# Patient Record
Sex: Female | Born: 1955 | Race: White | Hispanic: No | Smoking: Never smoker
Health system: Southern US, Community
[De-identification: ages and names within clinical notes are randomized; demographics above are authoritative.]

## PROBLEM LIST (undated history)

## (undated) DIAGNOSIS — J449 Chronic obstructive pulmonary disease, unspecified: Secondary | ICD-10-CM

## (undated) DIAGNOSIS — R87629 Unspecified abnormal cytological findings in specimens from vagina: Secondary | ICD-10-CM

## (undated) DIAGNOSIS — M1990 Unspecified osteoarthritis, unspecified site: Secondary | ICD-10-CM

## (undated) HISTORY — DX: Chronic obstructive pulmonary disease, unspecified: J44.9

## (undated) HISTORY — DX: Unspecified abnormal cytological findings in specimens from vagina: R87.629

## (undated) HISTORY — DX: Unspecified osteoarthritis, unspecified site: M19.90

---

## 2013-01-09 ENCOUNTER — Ambulatory Visit (INDEPENDENT_AMBULATORY_CARE_PROVIDER_SITE_OTHER): Payer: No Typology Code available for payment source

## 2013-01-09 ENCOUNTER — Encounter (INDEPENDENT_AMBULATORY_CARE_PROVIDER_SITE_OTHER): Payer: Self-pay

## 2013-01-09 VITALS — BP 128/50 | HR 73 | Resp 18

## 2013-01-09 DIAGNOSIS — M722 Plantar fascial fibromatosis: Secondary | ICD-10-CM

## 2013-01-09 DIAGNOSIS — M79609 Pain in unspecified limb: Secondary | ICD-10-CM

## 2013-01-09 DIAGNOSIS — D219 Benign neoplasm of connective and other soft tissue, unspecified: Secondary | ICD-10-CM

## 2013-01-09 DIAGNOSIS — D3610 Benign neoplasm of peripheral nerves and autonomic nervous system, unspecified: Secondary | ICD-10-CM

## 2013-01-09 DIAGNOSIS — M2010 Hallux valgus (acquired), unspecified foot: Secondary | ICD-10-CM

## 2013-01-09 MED ORDER — DICLOFENAC SODIUM 75 MG PO TBEC: 75.0000 mg | DELAYED_RELEASE_TABLET | Freq: Two times a day (BID) | ORAL | Status: DC

## 2013-01-09 NOTE — Progress Notes (Signed)
  Subjective:    Patient ID: Sheri Little, female    DOB: August 24, 1955, 57 y.o.   MRN: [id number]  HPI my right foot hurts on the balls of the foot and the bunion area and then it goes up into my toes and I work long hours and not much swelling and hurts in my arch some and went to primary care about 3 weeks ago and got my foot x-rayed Patient and she is complaining of knots or tightening in the arch of her foot right foot more so than left. Also notable bunion deformity right more significant than left. Has difficult time finding shoes to fit properly due to the width of her right foot.   Review of Systems  Constitutional: Negative.   HENT: Negative.   Eyes: Negative.   Respiratory: Negative.   Cardiovascular: Negative.   Gastrointestinal: Negative.   Endocrine: Negative.   Genitourinary: Negative.   Musculoskeletal:       Joint pain  Skin: Negative.   Allergic/Immunologic: Negative.   Neurological: Negative.  Negative for dizziness.  Hematological: Negative.   Psychiatric/Behavioral: Negative.        Objective:   Physical Exam Neurovascular status is intact with pedal pulses palpable DP and PT +2/4 bilateral. Capillary fill time 3 seconds all digits skin temperature warm turgor normal no edema rubor pallor or varicosities noted. Neurologically epicritic and proprioceptive sensations appear to be intact. However patient is complaining of paresthesias involving the hallux and second third toes of the right foot describes some pins and needles and shooting pain at times especially with prolonged activities. Clinically there is prominence of the first MTP area lateral deviation of the hallux on the right foot x-rays reviewed reveal elevated I am angle greater than 12 sesamoid position 5 deviation of sesamoids asymmetric joint space very the first MTP joint. X-rays are nonweightbearing there is mild retrocalcaneal spurring noted on x-ray no inferior calcaneal spurring was identified however  on palpation there is thickening of plantar fascia and exquisite pain tenderness on palpation plantar fascial right more so than left although left also has tenderness as well. There may be tenderness on right lateral compression of second interspace consistent with possibly early neuroma symptomology secondary to history gait in changes and tightness from shoe wear due to the bunion deformity.       Assessment & Plan:  Assessment this time is #1 plantar fasciitis/heel spur syndrome bilateral right more so than left #2 hallux abductovalgus deformity bilateral right more significant left with mild capsulitis and earlier for arthropathy changes. Problem #3 possibly early neuritis or neuroma symptomology secondary space right foot. Plan at this time patient placed on a regimen of diclofenac osseous some problems with the Mobic was discontinued if there's any GI problems or exacerbation of symptoms otherwise wished plain Tylenol recommended ice to the arch and heel. Fascial strapping applied to the right foot recheck in 2 weeks for followup. Literature about bunions and plantar fasciitis dispensed for patient indicates orthoses in the future. Patient will also need to discuss the possible use of bunion correction and possibly for surgical intervention at some point in the future. Followup with in 2 weeks  Alvan Dame DPM

## 2013-01-09 NOTE — Patient Instructions (Signed)

## 2013-01-22 ENCOUNTER — Ambulatory Visit (INDEPENDENT_AMBULATORY_CARE_PROVIDER_SITE_OTHER): Payer: No Typology Code available for payment source | Admitting: Podiatrist

## 2013-01-22 ENCOUNTER — Encounter: Payer: Self-pay | Admitting: Podiatrist

## 2013-01-22 VITALS — BP 129/71 | HR 63 | Resp 18

## 2013-01-22 DIAGNOSIS — M722 Plantar fascial fibromatosis: Secondary | ICD-10-CM

## 2013-01-22 MED ORDER — DICLOFENAC SODIUM 75 MG PO TBEC: 75.0000 mg | DELAYED_RELEASE_TABLET | Freq: Two times a day (BID) | ORAL | Status: AC

## 2013-01-22 NOTE — Progress Notes (Deleted)
°  Subjective:    Patient ID: Sheri Little, female    DOB: 08-29-1955, 57 y.o.   MRN: [id number]  HPI my heel is better on my right foot and my bunion was hurting this morning and was throbbing and some redness to it    Review of Systems  Constitutional: Negative.   HENT: Negative.   Eyes: Negative.   Respiratory: Negative.   Cardiovascular: Negative.   Gastrointestinal: Negative.   Endocrine: Negative.   Genitourinary: Negative.   Musculoskeletal: Negative.   Skin: Negative.   Allergic/Immunologic: Negative.   Neurological: Negative.   Hematological: Negative.   Psychiatric/Behavioral: Negative.        Objective:   Physical Exam        Assessment & Plan:

## 2013-01-22 NOTE — Progress Notes (Signed)
Subjective: Sheri Little presents today for followup of arch pain on the right arch as well as pain on the right bunion. Patient states arch pain has improved significantly with the diclofenac and the plantar fascial strapping. She states that she has to work up to 12 hours a day on her feet and that she has noticed some improvement in her arch right. However she states that she still has some bunion pains which this morning were significant and painful. She saw Dr. Ralene Cork 2 weeks ago and is here for her followup visit.  Objective: Neurovascular status is intact and unchanged. No pain at the insertion of the plantar fascia on the medial calcaneal tubercle. Some discomfort along the mid arch region of the right foot is noted. Paresthesias and numbness of the right hallux second toe and third toe are noted. Moderate bunion deformity is also present. Appearance of lesser digits is within normal limits.  Assessment: Improving plantar fasciitis right; bunion deformity with paresthesias right  Plan: Instructed the patient on continued use of diclofenac and a refill was written for her. Also discussed continued use of a plantar fascial strapping which is removable for her use as well. Discussed the positive long-term benefits of an orthotic as well. Wrote stretching exercises as well as continued icing exercises. Discussed her bunion deformity and discussed conservative padding and shoe gear changes versus surgical correction of the bunion deformity. At this time the patient and it is unable to take time off of work and I discussed it would take at least 4 weeks for her to be off of work in order for her to properly heal from this type of surgery. When the patient is ready to proceed she will call for preoperative consult.

## 2013-01-22 NOTE — Patient Instructions (Addendum)
Look online for [employer] website-- they have great night braces for bunions that will help prevent them from getting worse  These stretching exercises are great for general arch and foot pain.     Plantar Fasciitis (Heel Spur Syndrome) with Rehab The plantar fascia is a fibrous, ligament-like, soft-tissue structure that spans the bottom of the foot. Plantar fasciitis is a condition that causes pain in the foot due to inflammation of the tissue. SYMPTOMS   Pain and tenderness on the underneath side of the foot.  Pain that worsens with standing or walking. CAUSES  Plantar fasciitis is caused by irritation and injury to the plantar fascia on the underneath side of the foot. Common mechanisms of injury include:  Direct trauma to bottom of the foot.  Damage to a small nerve that runs under the foot where the main fascia attaches to the heel bone. Stress placed on the plantar fascia due to any mild increased activity or injury RISK INCREASES WITH:   Obesity.  Poor strength and flexibility.  Improperly fitted shoes.  Tight calf muscles.  Flat feet.  Failure to warm-up properly before activity.  PREVENTION  Warm up and stretch properly before activity.  Strength, flexibility  Maintain a health body weight.  Avoid stress on the plantar fascia.  Wear properly fitted shoes, including arch supports for individuals who have flat feet. PROGNOSIS  If treated properly, then the symptoms of plantar fasciitis usually resolve without surgery. However, occasionally surgery is necessary. RELATED COMPLICATIONS   Recurrent symptoms that may result in a chronic condition.  Problems of the lower back that are caused by compensating for the injury, such as limping.  Pain or weakness of the foot during push-off following surgery.  Chronic inflammation, scarring, and partial or complete fascia tear, occurring more often from repeated injections. TREATMENT  Treatment initially involves  the use of ice and medication to help reduce pain and inflammation. The use of strengthening and stretching exercises may help reduce pain with activity, especially stretches of the Achilles tendon.  Your caregiver may recommend that you use arch supports to help reduce stress on the plantar fascia. Often, corticosteroid injections are given to reduce inflammation. If symptoms persist for greater than 6 months despite non-surgical (conservative), then surgery may be recommended.  MEDICATION   If pain medication is necessary, then nonsteroidal anti-inflammatory medications, such as aspirin and ibuprofen, or other minor pain relievers, such as acetaminophen, are often recommended. Corticosteroid injections may be given by your caregiver.  HEAT AND COLD  Cold treatment (icing) relieves pain and reduces inflammation. Cold treatment should be applied for 10 to 15 minutes every 2 to 3 hours for inflammation and pain and immediately after any activity that aggravates your symptoms. Use ice packs or massage the area with a piece of ice (ice massage).  Heat treatment may be used prior to performing the stretching and strengthening activities prescribed by your caregiver, physical therapist, or athletic trainer. Use a heat pack or soak the injury in warm water. SEEK IMMEDIATE MEDICAL CARE IF:  Treatment seems to offer no benefit, or the condition worsens.  Any medications produce adverse side effects.    EXERCISES-- perform each exercise a total of 10-15 repetitions.  Hold for 30 seconds and perform 3 times per day   RANGE OF MOTION (ROM) AND STRETCHING EXERCISES - Plantar Fasciitis (Heel Spur Syndrome) These exercises may help you when beginning to rehabilitate your injury.   While completing these exercises, remember:   Restoring tissue  flexibility helps normal motion to return to the joints. This allows healthier, less painful movement and activity.  An effective stretch should be held for at  least 30 seconds.  A stretch should never be painful. You should only feel a gentle lengthening or release in the stretched tissue. RANGE OF MOTION - Toe Extension, Flexion  Sit with your right / left leg crossed over your opposite knee.  Grasp your toes and gently pull them back toward the top of your foot. You should feel a stretch on the bottom of your toes and/or foot.  Hold this stretch for __________ seconds.  Now, gently pull your toes toward the bottom of your foot. You should feel a stretch on the top of your toes and or foot.  Hold this stretch for __________ seconds. Repeat __________ times. Complete this stretch __________ times per day.  RANGE OF MOTION - Ankle Dorsiflexion, Active Assisted  Remove shoes and sit on a chair that is preferably not on a carpeted surface.  Place right / left foot under knee. Extend your opposite leg for support.  Keeping your heel down, slide your right / left foot back toward the chair until you feel a stretch at your ankle or calf. If you do not feel a stretch, slide your bottom forward to the edge of the chair, while still keeping your heel down.  Hold this stretch for __________ seconds. Repeat __________ times. Complete this stretch __________ times per day.  STRETCH  Gastroc, Standing  Place hands on wall.  Extend right / left leg, keeping the front knee somewhat bent.  Slightly point your toes inward on your back foot.  Keeping your right / left heel on the floor and your knee straight, shift your weight toward the wall, not allowing your back to arch.  You should feel a gentle stretch in the right / left calf. Hold this position for __________ seconds. Repeat __________ times. Complete this stretch __________ times per day. STRETCH  Soleus, Standing  Place hands on wall.  Extend right / left leg, keeping the other knee somewhat bent.  Slightly point your toes inward on your back foot.  Keep your right / left heel on the  floor, bend your back knee, and slightly shift your weight over the back leg so that you feel a gentle stretch deep in your back calf.  Hold this position for __________ seconds. Repeat __________ times. Complete this stretch __________ times per day. STRETCH  Gastrocsoleus, Standing  Note: This exercise can place a lot of stress on your foot and ankle. Please complete this exercise only if specifically instructed by your caregiver.   Place the ball of your right / left foot on a step, keeping your other foot firmly on the same step.  Hold on to the wall or a rail for balance.  Slowly lift your other foot, allowing your body weight to press your heel down over the edge of the step.  You should feel a stretch in your right / left calf.  Hold this position for __________ seconds.  Repeat this exercise with a slight bend in your right / left knee. Repeat __________ times. Complete this stretch __________ times per day.  STRENGTHENING EXERCISES - Plantar Fasciitis (Heel Spur Syndrome)  These exercises may help you when beginning to rehabilitate your injury. They may resolve your symptoms with or without further involvement from your physician, physical therapist or athletic trainer. While completing these exercises, remember:   Muscles can gain  both the endurance and the strength needed for everyday activities through controlled exercises.  Complete these exercises as instructed by your physician, physical therapist or athletic trainer. Progress the resistance and repetitions only as guided.

## 2017-10-31 DIAGNOSIS — R0602 Shortness of breath: Secondary | ICD-10-CM

## 2019-05-17 ENCOUNTER — Other Ambulatory Visit: Payer: Self-pay | Admitting: Internal Medicine

## 2019-05-17 DIAGNOSIS — Z1231 Encounter for screening mammogram for malignant neoplasm of breast: Secondary | ICD-10-CM

## 2019-05-30 ENCOUNTER — Other Ambulatory Visit: Payer: Self-pay

## 2019-05-30 ENCOUNTER — Ambulatory Visit
Admission: RE | Admit: 2019-05-30 | Discharge: 2019-05-30 | Disposition: A | Discharge status: Home / Self Care | Payer: Self-pay | Source: Ambulatory Visit | Attending: Internal Medicine | Admitting: Internal Medicine

## 2019-05-30 DIAGNOSIS — Z1231 Encounter for screening mammogram for malignant neoplasm of breast: Secondary | ICD-10-CM

## 2020-10-13 IMAGING — MG DIGITAL SCREENING BILAT W/ TOMO W/ CAD
8 series · 8 of 24 positions shown · non-contrast
Comparison: Previous exam(s).

CLINICAL DATA: Screening.

EXAM:
DIGITAL SCREENING BILATERAL MAMMOGRAM WITH TOMO AND CAD

[R CC synth-2D]
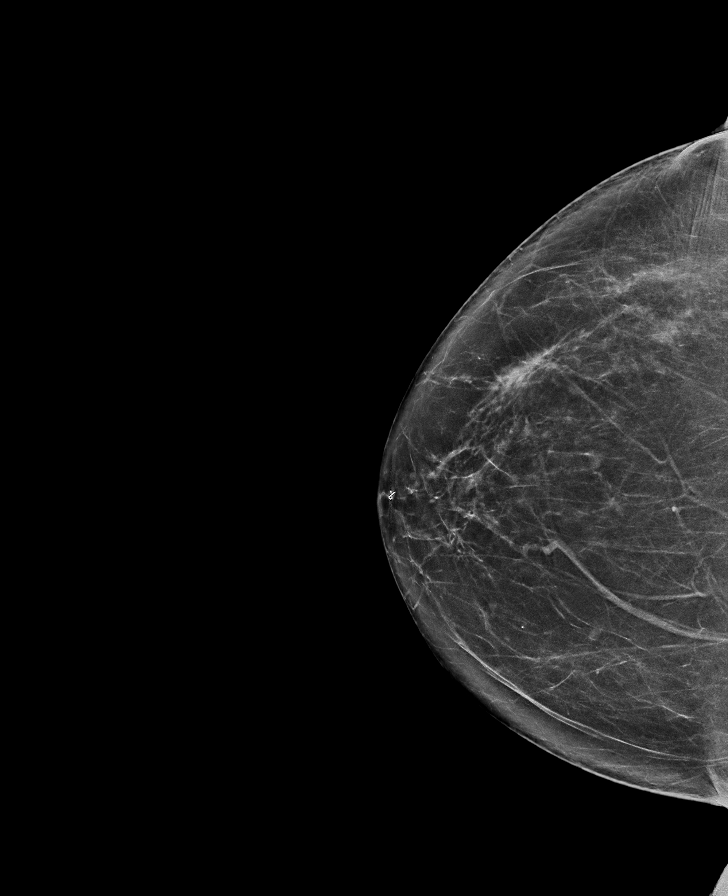

[R MLO synth-2D]
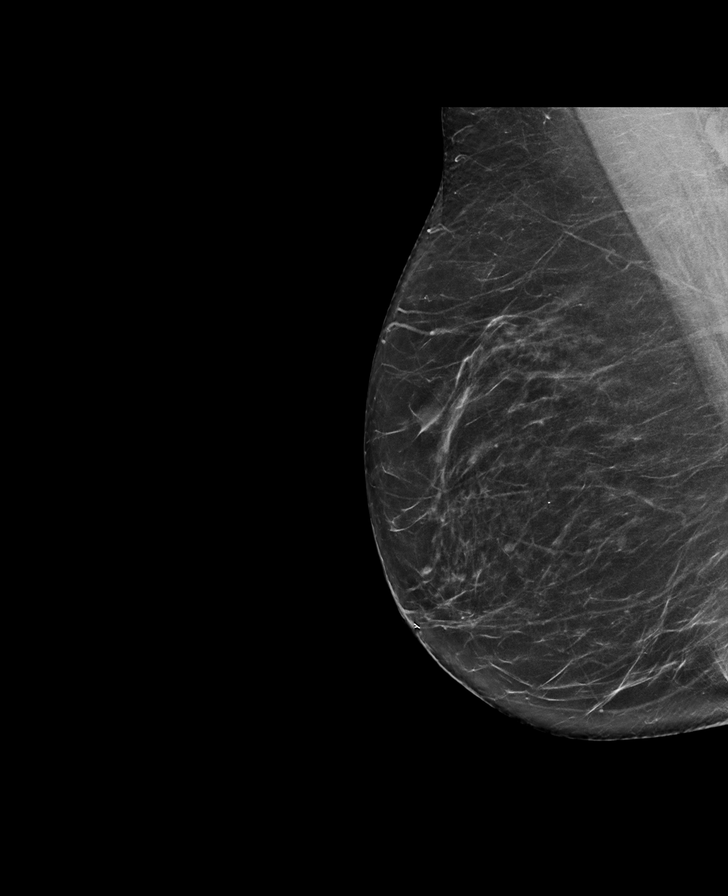

[L MLO synth-2D]
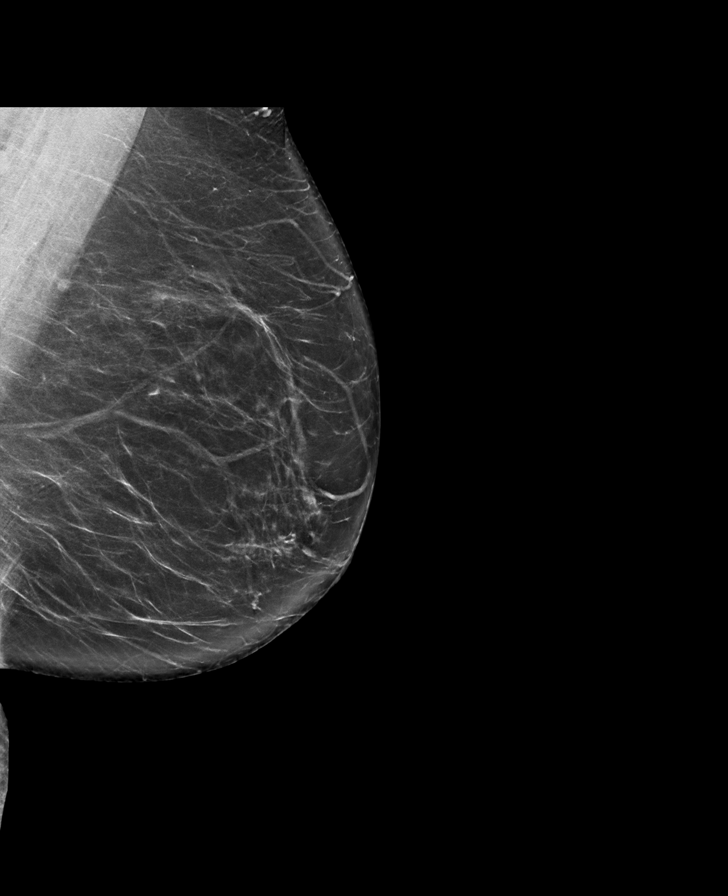

[L CC synth-2D]
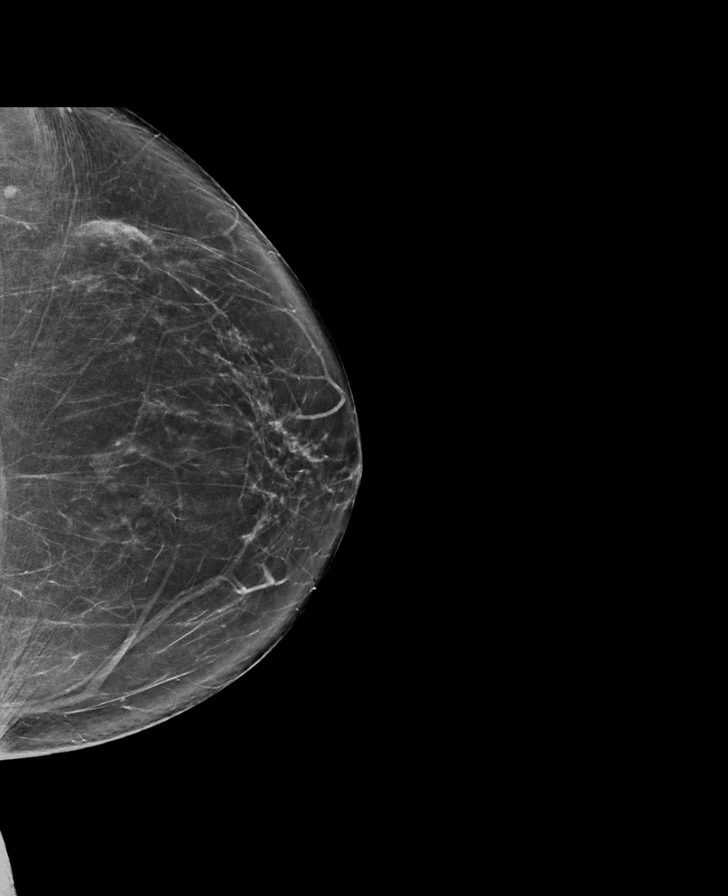

[R MLO tomo · tomo slice 37/74.0]
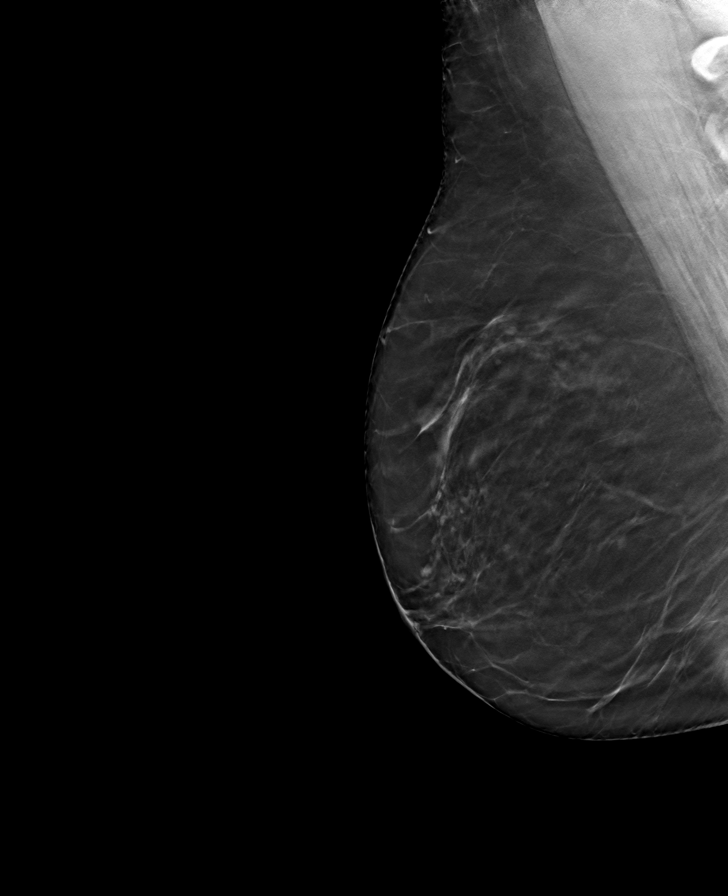

[R CC tomo · tomo slice 35/70.0]
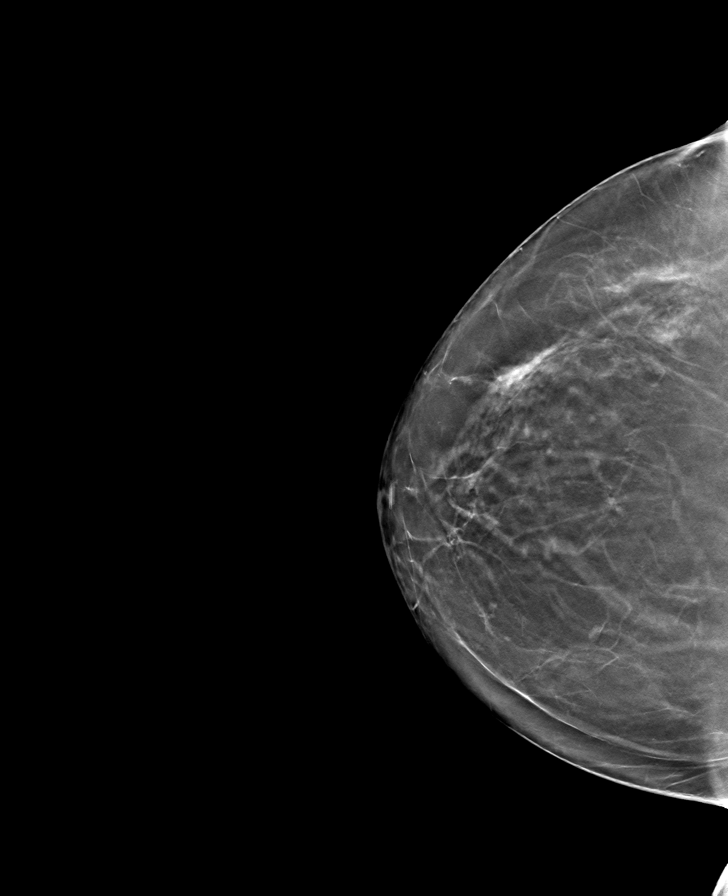

[L CC tomo · tomo slice 37/74.0]
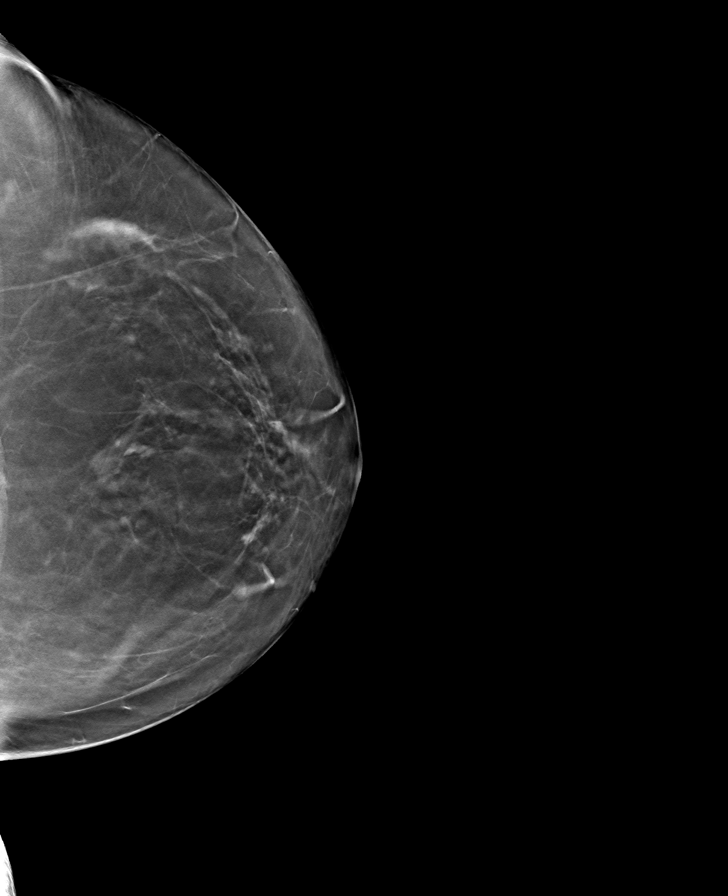

[L MLO tomo · tomo slice 40/79.0]
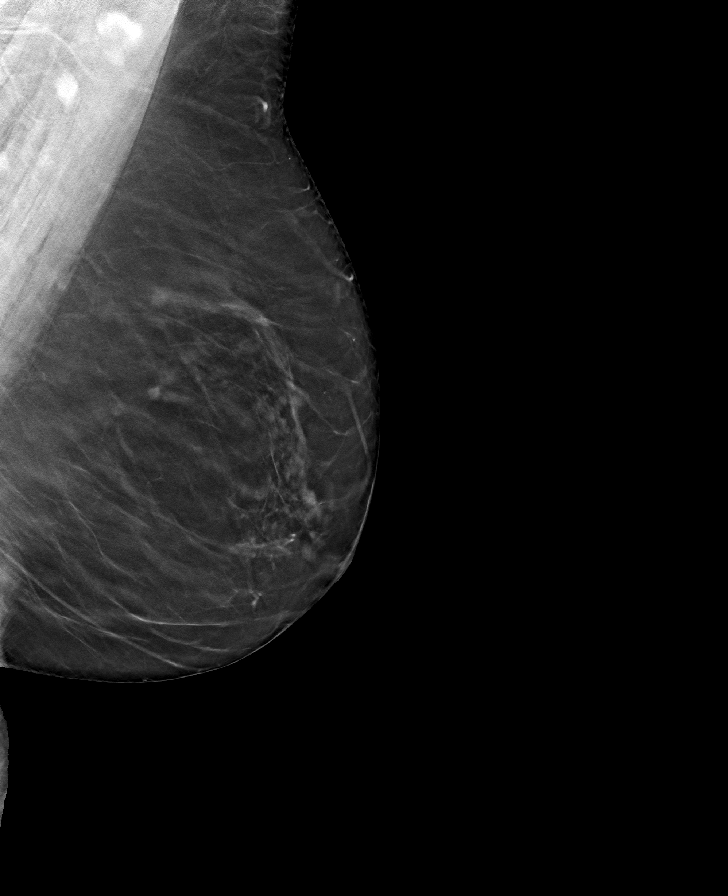

[8 of 24 positions shown; findings below may reference images not displayed]

ACR Breast Density Category b: There are scattered areas of
fibroglandular density.
FINDINGS: There are no findings suspicious for malignancy. Images were
processed with CAD.
IMPRESSION: No mammographic evidence of malignancy. A result letter of this
screening mammogram will be mailed directly to the patient.

RECOMMENDATION:
Screening mammogram in one year. (Code:[id number])

BI-RADS CATEGORY  1: Negative.

## 2021-01-11 DIAGNOSIS — M9903 Segmental and somatic dysfunction of lumbar region: Secondary | ICD-10-CM | POA: Diagnosis not present

## 2021-01-11 DIAGNOSIS — M9905 Segmental and somatic dysfunction of pelvic region: Secondary | ICD-10-CM | POA: Diagnosis not present

## 2021-01-11 DIAGNOSIS — M5383 Other specified dorsopathies, cervicothoracic region: Secondary | ICD-10-CM | POA: Diagnosis not present

## 2021-01-11 DIAGNOSIS — M9902 Segmental and somatic dysfunction of thoracic region: Secondary | ICD-10-CM | POA: Diagnosis not present

## 2021-01-11 DIAGNOSIS — M9901 Segmental and somatic dysfunction of cervical region: Secondary | ICD-10-CM | POA: Diagnosis not present

## 2021-01-11 DIAGNOSIS — S336XXA Sprain of sacroiliac joint, initial encounter: Secondary | ICD-10-CM | POA: Diagnosis not present

## 2021-01-11 DIAGNOSIS — M50323 Other cervical disc degeneration at C6-C7 level: Secondary | ICD-10-CM | POA: Diagnosis not present

## 2021-10-11 ENCOUNTER — Encounter: Payer: Self-pay | Admitting: General Practice

## 2021-10-12 ENCOUNTER — Encounter: Payer: Self-pay | Admitting: General Practice

## 2021-10-29 ENCOUNTER — Ambulatory Visit (INDEPENDENT_AMBULATORY_CARE_PROVIDER_SITE_OTHER): Payer: Medicare Other | Admitting: Family Medicine

## 2021-10-29 ENCOUNTER — Encounter: Payer: Self-pay | Admitting: Family Medicine

## 2021-10-29 VITALS — BP 147/61 | HR 77 | Wt 166.0 lb

## 2021-10-29 DIAGNOSIS — R87610 Atypical squamous cells of undetermined significance on cytologic smear of cervix (ASC-US): Secondary | ICD-10-CM

## 2021-10-29 NOTE — Progress Notes (Signed)
   Subjective:    Patient ID: Sheri Little, female    DOB: 1955/04/23, 66 y.o.   MRN: [id number]  HPI Patient seen for abnormal Pap smear.  She had a Pap smear in July that showed ASCUS negative HPV.  She was referred to our office for management.  About 35 years ago, she had an abnormal Pap smear and had a colposcopy followed by cryotherapy.  Since then, she has had normal Pap smears.  She is not sexually active.   Review of Systems     Objective:   Physical Exam Vitals reviewed.  Constitutional:      Appearance: Normal appearance.  Skin:    General: Skin is warm and dry.     Capillary Refill: Capillary refill takes less than 2 seconds.  Neurological:     General: No focal deficit present.     Mental Status: She is alert.  Psychiatric:        Mood and Affect: Mood normal.        Behavior: Behavior normal.        Thought Content: Thought content normal.        Assessment & Plan:  1. ASCUS of cervix with negative high risk HPV I discussed with patient that her Pap has low risk of turning to cervical cancer.  Recommendation at this point would be to continue with normal screening.  That normal screening with be recommended in 3 years, but would recommend annual exams including speculum exams.  Patient indicated that she may prefer Pap smear every 2 years  15 minutes of counseling done with the patient.

## 2021-10-29 NOTE — Progress Notes (Signed)
Patient says she has had cryo done approx 35 years ago. [facility]
